# Patient Record
Sex: Female | Born: 1990 | Race: White | Hispanic: No | Marital: Single
Health system: Midwestern US, Community
[De-identification: ages and names within clinical notes are randomized; demographics above are authoritative.]

## PROBLEM LIST (undated history)

## (undated) DIAGNOSIS — M797 Fibromyalgia: Secondary | ICD-10-CM

## (undated) HISTORY — PX: TONSILLECTOMY: SUR1361

## (undated) HISTORY — PX: WISDOM TOOTH EXTRACTION: SHX21

---
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
## 2017-10-26 ENCOUNTER — Emergency Department: Payer: BLUE CROSS/BLUE SHIELD

## 2017-10-26 ENCOUNTER — Encounter: Payer: Self-pay | Admitting: Intensive Care

## 2017-10-26 ENCOUNTER — Emergency Department
Admission: EM | Admit: 2017-10-26 | Discharge: 2017-10-26 | Disposition: A | Discharge status: Home / Self Care | Payer: BLUE CROSS/BLUE SHIELD | Attending: Emergency Medicine | Admitting: Emergency Medicine

## 2017-10-26 DIAGNOSIS — R079 Chest pain, unspecified: Secondary | ICD-10-CM | POA: Diagnosis present

## 2017-10-26 HISTORY — DX: Fibromyalgia: M79.7

## 2017-10-26 LAB — HCG, QUANTITATIVE, PREGNANCY

## 2017-10-26 LAB — BASIC METABOLIC PANEL
Anion gap: 10 (ref 5–15)
BUN: 12 mg/dL (ref 6–20)
CO2: 22 mmol/L (ref 22–32)
Calcium: 9 mg/dL (ref 8.9–10.3)
Chloride: 105 mmol/L (ref 101–111)
Creatinine, Ser: 0.73 mg/dL (ref 0.44–1.00)
GFR calc Af Amer: >60 mL/min (ref >60)
GFR calc non Af Amer: >60 mL/min (ref >60)
GLUCOSE: 97 mg/dL (ref 65–99)
Potassium: 3.9 mmol/L (ref 3.5–5.1)
SODIUM: 137 mmol/L (ref 135–145)

## 2017-10-26 LAB — CBC
HCT: 41.6 % (ref 35.0–47.0)
Hemoglobin: 14 g/dL (ref 12.0–16.0)
MCH: 29.4 pg (ref 26.0–34.0)
MCHC: 33.5 g/dL (ref 32.0–36.0)
MCV: 87.6 fL (ref 80.0–100.0)
Platelets: 290 10*3/uL (ref 150–440)
RBC: 4.75 MIL/uL (ref 3.80–5.20)
RDW: 13.3 % (ref 11.5–14.5)
WBC: 6.8 10*3/uL (ref 3.6–11.0)

## 2017-10-26 LAB — FIBRIN DERIVATIVES D-DIMER (ARMC ONLY): Fibrin derivatives D-dimer (ARMC): 171.28 ng/mL (FEU) (ref 0.00–499.00)

## 2017-10-26 LAB — POCT PREGNANCY, URINE: Preg Test, Ur: NEGATIVE

## 2017-10-26 LAB — TROPONIN I: Troponin I: <0.03 ng/mL (ref <0.03)

## 2017-10-26 MED ORDER — ASPIRIN 81 MG PO CHEW
324.0000 mg | CHEWABLE_TABLET | Freq: Once | ORAL | Status: AC
  Administered 2017-10-26: 324 mg via ORAL
  Filled 2017-10-26: qty 4

## 2017-10-26 NOTE — ED Triage Notes (Addendum)
Patient reports she was at work when her chest pain started about an hour ago. Describes L sided stabbing chest pain with no radiation. Patient drove to Er with no problems. Ambulatory in triage with no problems. Denies anxiety HX. Patient on phone in triage. No respiratory distress noted

## 2017-10-26 NOTE — ED Provider Notes (Signed)
[facility] Emergency Department Provider Note  ____________________________________________  Time seen: Approximately 3:24 PM  I have reviewed the triage vital signs and the nursing notes.   HISTORY  Chief Complaint Chest Pain    HPI Chloe Ramirez is a 27 y.o. female history of fibromyalgia presenting with chest pain.  The patient reports that she was at work at [employer], standing and scanning panties when she had the acute onset of a severe central and slightly left-sided chest pain that was worse with deep breaths.  She did not have any shortness of breath, palpitations, lightheadedness or syncope.  The pain did not radiate.  She has had no lower extremity swelling or calf pain.  She does take oral contraceptive pills.  She denies smoking.  No cocaine use.  Past Medical History:  Diagnosis Date  . Fibromyalgia     There are no active problems to display for this patient.   History reviewed. No pertinent surgical history.    Allergies Phenergan [promethazine hcl] and Sulfa antibiotics  History reviewed. No pertinent family history.  Social History Social History   Tobacco Use  . Smoking status: Never Smoker  . Smokeless tobacco: Never Used  Substance Use Topics  . Alcohol use: Yes    Comment: occ  . Drug use: No    Review of Systems Constitutional: No fever/chills.  B. Eyes: No visual changes. ENT: No sore throat. No congestion or rhinorrhea. Cardiovascular: Positive central and left-sided chest pain. Denies palpitations. Respiratory: Denies shortness of breath.  No cough. Gastrointestinal: No abdominal pain.  No nausea, no vomiting.  No diarrhea.  No constipation. Genitourinary: Negative for dysuria. Musculoskeletal: Negative for back pain.  No lower externally swelling or calf pain. Skin: Negative for rash. Neurological: Negative for headaches. No focal numbness, tingling or weakness.      ____________________________________________   PHYSICAL EXAM:  VITAL SIGNS: ED Triage Vitals  Enc Vitals Group     BP 10/26/17 1236 132/81     Pulse Rate 10/26/17 1236 73     Resp 10/26/17 1236 18     Temp 10/26/17 1236 98.7 F (37.1 C)     Temp Source 10/26/17 1236 Oral     SpO2 10/26/17 1236 100 %     Weight 10/26/17 1237 228 lb (103.4 kg)     Height 10/26/17 1237 5\' 6"  (1.676 m)     Head Circumference --      Peak Flow --      Pain Score 10/26/17 1236 8     Pain Loc --      Pain Edu? --      Excl. in GC? --     Constitutional: Alert and oriented. Well appearing and in no acute distress. Answers questions appropriately. Eyes: Conjunctivae are normal.  EOMI. No scleral icterus. Head: Atraumatic. Nose: No congestion/rhinnorhea. Mouth/Throat: Mucous membranes are moist.  Neck: No stridor.  Supple.   Cardiovascular: Normal rate, regular rhythm. No murmurs, rubs or gallops.  Respiratory: Normal respiratory effort.  No accessory muscle use or retractions. Lungs CTAB.  No wheezes, rales or ronchi. Gastrointestinal: Soft, nontender and nondistended.  No guarding or rebound.  No peritoneal signs. Musculoskeletal: No LE edema. No ttp in the calves or palpable cords.  Negative Homan's sign. Neurologic:  A&Ox3.  Speech is clear.  Face and smile are symmetric.  EOMI.  Moves all extremities well. Skin:  Skin is warm, dry and intact. No rash noted. Psychiatric: Mood and affect are normal. Speech  and behavior are normal.  Normal judgement.  ____________________________________________   LABS (all labs ordered are listed, but only abnormal results are displayed)  Labs Reviewed  BASIC METABOLIC PANEL  CBC  TROPONIN I  HCG, QUANTITATIVE, PREGNANCY  FIBRIN DERIVATIVES D-DIMER ([facility] ONLY)  TROPONIN I  POCT PREGNANCY, URINE   ____________________________________________  EKG  ED ECG REPORT I, Rockne Menghini, the attending physician, personally viewed and  interpreted this ECG.   Date: 10/26/2017  EKG Time: 1233  Rate: 85  Rhythm: normal sinus rhythm  Axis: normal  Intervals:none  ST&T Change: No STEMI  ____________________________________________  RADIOLOGY  Dg Chest 2 View  Result Date: 10/26/2017 CLINICAL DATA:  Chest pain. EXAM: CHEST  2 VIEW COMPARISON:  None. FINDINGS: The heart size and mediastinal contours are within normal limits. Both lungs are clear. The visualized skeletal structures are unremarkable. IMPRESSION: No active cardiopulmonary disease. Electronically Signed   By: Obie Dredge M.D.   On: 10/26/2017 13:06    ____________________________________________   PROCEDURES  Procedure(s) performed: None  Procedures  Critical Care performed: No ____________________________________________   INITIAL IMPRESSION / ASSESSMENT AND PLAN / ED COURSE  Pertinent labs & imaging results that were available during my care of the patient were reviewed by me and considered in my medical decision making (see chart for details).  27 y.o. female with a history of fibromyalgia presenting with pleuritic chest pain that started an hour prior to arrival without any other associated symptoms.  Overall, the patient is hemodynamically stable.  Her EKG does not show any ischemic changes or evidence of arrhythmia including prolonged QTC, Brugada syndrome or hypertrophy.  Given that the patient does have a pleuritic component to her chest pain and is on estrogen, will get a d-dimer for evaluation of possible clot.  However, my pretest suspicion for PE is very low given her normal heart rate, normal oxygenation, and lack of shortness of breath, no evidence of DVT.  Other possible etiologies include GI pathology although she does not have any positional or food related symptoms.  Plan reevaluation for final disposition.  ED Course: The patient had a reassuring course in the emergency department.  She retained remained hemodynamically stable.   Her troponin was negative and she had a normal d-dimer.  The patient was discharged in stable condition.  Return precautions as well as follow-up instructions were discussed  ____________________________________________  FINAL CLINICAL IMPRESSION(S) / ED DIAGNOSES  Final diagnoses:  Chest pain, unspecified type    Clinical Course as of Oct 26 2304  Wed Oct 26, 2017  1657 Troponin I: <0.03 [HV]    Clinical Course User Index [HV] Andris Baumann, [place]      NEW MEDICATIONS STARTED DURING THIS VISIT:  There are no discharge medications for this patient.     Rockne Menghini, MD 10/26/17 [phone]

## 2017-10-26 NOTE — ED Notes (Signed)
Pt ambulatory upon discharge. Verbalized understanding of discharge instructions, follow-up care and pain management. VSS. Skin warm and dry. A&O x4.

## 2017-10-26 NOTE — Discharge Instructions (Signed)
Please return to the emergency department if you develop severe pain, lightheadedness or fainting, shortness of breath, or any other symptoms concerning to you. °

## 2018-03-22 ENCOUNTER — Encounter: Payer: Self-pay | Admitting: Certified Nurse Midwife

## 2018-03-22 ENCOUNTER — Ambulatory Visit (INDEPENDENT_AMBULATORY_CARE_PROVIDER_SITE_OTHER): Payer: BLUE CROSS/BLUE SHIELD | Admitting: Certified Nurse Midwife

## 2018-03-22 VITALS — BP 130/89 | HR 89 | Ht 66.0 in | Wt 240.4 lb

## 2018-03-22 DIAGNOSIS — Z01419 Encounter for gynecological examination (general) (routine) without abnormal findings: Secondary | ICD-10-CM

## 2018-03-22 DIAGNOSIS — E669 Obesity, unspecified: Secondary | ICD-10-CM | POA: Insufficient documentation

## 2018-03-22 DIAGNOSIS — Z6838 Body mass index (BMI) 38.0-38.9, adult: Secondary | ICD-10-CM | POA: Diagnosis not present

## 2018-03-22 DIAGNOSIS — N926 Irregular menstruation, unspecified: Secondary | ICD-10-CM

## 2018-03-22 DIAGNOSIS — Z01411 Encounter for gynecological examination (general) (routine) with abnormal findings: Secondary | ICD-10-CM

## 2018-03-22 MED ORDER — NORETHIN ACE-ETH ESTRAD-FE 1-20 MG-MCG PO TABS: 1.0000 | ORAL_TABLET | Freq: Every day | ORAL | 11 refills | Status: DC

## 2018-03-22 NOTE — Patient Instructions (Addendum)
Preventive Care 18-39 Years, Female Preventive care refers to lifestyle choices and visits with your health care provider that can promote health and wellness. What does preventive care include?  A yearly physical exam. This is also called an annual well check.  Dental exams once or twice a year.  Routine eye exams. Ask your health care provider how often you should have your eyes checked.  Personal lifestyle choices, including: ? Daily care of your teeth and gums. ? Regular physical activity. ? Eating a healthy diet. ? Avoiding tobacco and drug use. ? Limiting alcohol use. ? Practicing safe sex. ? Taking vitamin and mineral supplements as recommended by your health care provider. What happens during an annual well check? The services and screenings done by your health care provider during your annual well check will depend on your age, overall health, lifestyle risk factors, and family history of disease. Counseling Your health care provider may ask you questions about your:  Alcohol use.  Tobacco use.  Drug use.  Emotional well-being.  Home and relationship well-being.  Sexual activity.  Eating habits.  Work and work Statistician.  Method of birth control.  Menstrual cycle.  Pregnancy history.  Screening You may have the following tests or measurements:  Height, weight, and BMI.  Diabetes screening. This is done by checking your blood sugar (glucose) after you have not eaten for a while (fasting).  Blood pressure.  Lipid and cholesterol levels. These may be checked every 5 years starting at age 66.  Skin check.  Hepatitis C blood test.  Hepatitis B blood test.  Sexually transmitted disease (STD) testing.  BRCA-related cancer screening. This may be done if you have a family history of breast, ovarian, tubal, or peritoneal cancers.  Pelvic exam and Pap test. This may be done every 3 years starting at age 40. Starting at age 59, this may be done every 5  years if you have a Pap test in combination with an HPV test.  Discuss your test results, treatment options, and if necessary, the need for more tests with your health care provider. Vaccines Your health care provider may recommend certain vaccines, such as:  Influenza vaccine. This is recommended every year.  Tetanus, diphtheria, and acellular pertussis (Tdap, Td) vaccine. You may need a Td booster every 10 years.  Varicella vaccine. You may need this if you have not been vaccinated.  HPV vaccine. If you are 69 or younger, you may need three doses over 6 months.  Measles, mumps, and rubella (MMR) vaccine. You may need at least one dose of MMR. You may also need a second dose.  Pneumococcal 13-valent conjugate (PCV13) vaccine. You may need this if you have certain conditions and were not previously vaccinated.  Pneumococcal polysaccharide (PPSV23) vaccine. You may need one or two doses if you smoke cigarettes or if you have certain conditions.  Meningococcal vaccine. One dose is recommended if you are age 27-21 years and a first-year college student living in a residence hall, or if you have one of several medical conditions. You may also need additional booster doses.  Hepatitis A vaccine. You may need this if you have certain conditions or if you travel or work in places where you may be exposed to hepatitis A.  Hepatitis B vaccine. You may need this if you have certain conditions or if you travel or work in places where you may be exposed to hepatitis B.  Haemophilus influenzae type b (Hib) vaccine. You may need this if  you have certain risk factors.  Talk to your health care provider about which screenings and vaccines you need and how often you need them. This information is not intended to replace advice given to you by your health care provider. Make sure you discuss any questions you have with your health care provider. Document Released: 10/19/2001 Document Revised: 05/12/2016  Document Reviewed: 06/24/2015 Elsevier Interactive Patient Education  2018 [employer] Calories are units of energy. Your body needs a certain amount of calories from food to keep you going throughout the day. When you eat more calories than your body needs, your body stores the extra calories as fat. When you eat fewer calories than your body needs, your body burns fat to get the energy it needs. Calorie counting means keeping track of how many calories you eat and drink each day. Calorie counting can be helpful if you need to lose weight. If you make sure to eat fewer calories than your body needs, you should lose weight. Ask your health care provider what a healthy weight is for you. For calorie counting to work, you will need to eat the right number of calories in a day in order to lose a healthy amount of weight per week. A dietitian can help you determine how many calories you need in a day and will give you suggestions on how to reach your calorie goal.  A healthy amount of weight to lose per week is usually 1-2 lb (0.5-0.9 kg). This usually means that your daily calorie intake should be reduced by 500-750 calories.  Eating 1,200 - 1,500 calories per day can help most women lose weight.  Eating 1,500 - 1,800 calories per day can help most men lose weight.  What is my plan? My goal is to have __________ calories per day. If I have this many calories per day, I should lose around __________ pounds per week. What do I need to know about calorie counting? In order to meet your daily calorie goal, you will need to:  Find out how many calories are in each food you would like to eat. Try to do this before you eat.  Decide how much of the food you plan to eat.  Write down what you ate and how many calories it had. Doing this is called keeping a food log.  To successfully lose weight, it is important to balance calorie counting with a healthy lifestyle  that includes regular activity. Aim for 150 minutes of moderate exercise (such as walking) or 75 minutes of vigorous exercise (such as running) each week. Where do I find calorie information?  The number of calories in a food can be found on a Nutrition Facts label. If a food does not have a Nutrition Facts label, try to look up the calories online or ask your dietitian for help. Remember that calories are listed per serving. If you choose to have more than one serving of a food, you will have to multiply the calories per serving by the amount of servings you plan to eat. For example, the label on a package of bread might say that a serving size is 1 slice and that there are 90 calories in a serving. If you eat 1 slice, you will have eaten 90 calories. If you eat 2 slices, you will have eaten 180 calories. How do I keep a food log? Immediately after each meal, record the following information in your food log:  What you ate. Don't forget to include toppings, sauces, and other extras on the food.  How much you ate. This can be measured in cups, ounces, or number of items.  How many calories each food and drink had.  The total number of calories in the meal.  Keep your food log near you, such as in a small notebook in your pocket, or use a mobile app or website. Some programs will calculate calories for you and show you how many calories you have left for the day to meet your goal. What are some calorie counting tips?  Use your calories on foods and drinks that will fill you up and not leave you hungry: ? Some examples of foods that fill you up are nuts and nut butters, vegetables, lean proteins, and high-fiber foods like whole grains. High-fiber foods are foods with more than 5 g fiber per serving. ? Drinks such as sodas, specialty coffee drinks, alcohol, and juices have a lot of calories, yet do not fill you up.  Eat nutritious foods and avoid empty calories. Empty calories are calories you  get from foods or beverages that do not have many vitamins or protein, such as candy, sweets, and soda. It is better to have a nutritious high-calorie food (such as an avocado) than a food with few nutrients (such as a bag of chips).  Know how many calories are in the foods you eat most often. This will help you calculate calorie counts faster.  Pay attention to calories in drinks. Low-calorie drinks include water and unsweetened drinks.  Pay attention to nutrition labels for "low fat" or "fat free" foods. These foods sometimes have the same amount of calories or more calories than the full fat versions. They also often have added sugar, starch, or salt, to make up for flavor that was removed with the fat.  Find a way of tracking calories that works for you. Get creative. Try different apps or programs if writing down calories does not work for you. What are some portion control tips?  Know how many calories are in a serving. This will help you know how many servings of a certain food you can have.  Use a measuring cup to measure serving sizes. You could also try weighing out portions on a kitchen scale. With time, you will be able to estimate serving sizes for some foods.  Take some time to put servings of different foods on your favorite plates, bowls, and cups so you know what a serving looks like.  Try not to eat straight from a bag or box. Doing this can lead to overeating. Put the amount you would like to eat in a cup or on a plate to make sure you are eating the right portion.  Use smaller plates, glasses, and bowls to prevent overeating.  Try not to multitask (for example, watch TV or use your computer) while eating. If it is time to eat, sit down at a table and enjoy your food. This will help you to know when you are full. It will also help you to be aware of what you are eating and how much you are eating. What are tips for following this plan? Reading food labels  Check the  calorie count compared to the serving size. The serving size may be smaller than what you are used to eating.  Check the source of the calories. Make sure the food you are eating is high in vitamins and protein and low  in saturated and trans fats. Shopping  Read nutrition labels while you shop. This will help you make healthy decisions before you decide to purchase your food.  Make a grocery list and stick to it. Cooking  Try to cook your favorite foods in a healthier way. For example, try baking instead of frying.  Use low-fat dairy products. Meal planning  Use more fruits and vegetables. Half of your plate should be fruits and vegetables.  Include lean proteins like poultry and fish. How do I count calories when eating out?  Ask for smaller portion sizes.  Consider sharing an entree and sides instead of getting your own entree.  If you get your own entree, eat only half. Ask for a box at the beginning of your meal and put the rest of your entree in it so you are not tempted to eat it.  If calories are listed on the menu, choose the lower calorie options.  Choose dishes that include vegetables, fruits, whole grains, low-fat dairy products, and lean protein.  Choose items that are boiled, broiled, grilled, or steamed. Stay away from items that are buttered, battered, fried, or served with cream sauce. Items labeled "crispy" are usually fried, unless stated otherwise.  Choose water, low-fat milk, unsweetened iced tea, or other drinks without added sugar. If you want an alcoholic beverage, choose a lower calorie option such as a glass of wine or light beer.  Ask for dressings, sauces, and syrups on the side. These are usually high in calories, so you should limit the amount you eat.  If you want a salad, choose a garden salad and ask for grilled meats. Avoid extra toppings like bacon, cheese, or fried items. Ask for the dressing on the side, or ask for olive oil and vinegar or lemon  to use as dressing.  Estimate how many servings of a food you are given. For example, a serving of cooked rice is  cup or about the size of half a baseball. Knowing serving sizes will help you be aware of how much food you are eating at restaurants. The list below tells you how big or small some common portion sizes are based on everyday objects: ? 1 oz-4 stacked dice. ? 3 oz-1 deck of cards. ? 1 tsp-1 die. ? 1 Tbsp- a ping-pong ball. ? 2 Tbsp-1 ping-pong ball. ?  cup- baseball. ? 1 cup-1 baseball. Summary  Calorie counting means keeping track of how many calories you eat and drink each day. If you eat fewer calories than your body needs, you should lose weight.  A healthy amount of weight to lose per week is usually 1-2 lb (0.5-0.9 kg). This usually means reducing your daily calorie intake by 500-750 calories.  The number of calories in a food can be found on a Nutrition Facts label. If a food does not have a Nutrition Facts label, try to look up the calories online or ask your dietitian for help.  Use your calories on foods and drinks that will fill you up, and not on foods and drinks that will leave you hungry.  Use smaller plates, glasses, and bowls to prevent overeating. This information is not intended to replace advice given to you by your health care provider. Make sure you discuss any questions you have with your health care provider. Document Released: 08/23/2005 Document Revised: 07/23/2016 Document Reviewed: 07/23/2016 Elsevier Interactive Patient Education  [employer].

## 2018-03-22 NOTE — Progress Notes (Signed)
GYNECOLOGY ANNUAL PREVENTATIVE CARE ENCOUNTER NOTE  Subjective:   Chloe Ramirez is a 27 y.o. G0P0000 female here for a routine annual gynecologic exam.  Current complaints: Difficulty loosing weight. Pt states that she wants to become pregnant next year. She admits to having history of irregular periods and it was mentioned to her by her previous MD that she may have PCOS. She has never had a formal work up for it.  She is currently on [place] for birth control. Her previous doctor put her on this because she had headaches.  Denies abnormal vaginal bleeding, discharge, pelvic pain, problems with intercourse or other gynecologic concerns.    Gynecologic History Patient's last menstrual period was 03/02/2018 (exact date). Contraception: oral progesterone-only contraceptive Last Pap: it been a while. Results were: states she had abnormal results with further testing was normal.  Last mammogram: N/A.   Obstetric History OB History  Gravida Para Term Preterm AB Living  0 0 0 0 0 0  SAB TAB Ectopic Multiple Live Births  0 0 0 0 0    Past Medical History:  Diagnosis Date  . Fibromyalgia     Past Surgical History:  Procedure Laterality Date  . TONSILLECTOMY    . WISDOM TOOTH EXTRACTION      Current Outpatient Medications on File Prior to Visit  Medication Sig Dispense Refill  . CAMILA 0.35 MG tablet Take 1 tablet by mouth daily.  1  . DULoxetine (CYMBALTA) 30 MG capsule Take by mouth daily.  2  . gabapentin (NEURONTIN) 300 MG capsule TAKE 1 CAPSULE (300 MG TOTAL) BY MOUTH NIGHTLY AS NEEDED (BACK PAIN).  11   No current facility-administered medications on file prior to visit.     Allergies  Allergen Reactions  . Phenergan [Promethazine Hcl]     Causes panic attacks and nausea  . Sulfa Antibiotics     swelling  . Latex Rash    Social History:  reports that she has never smoked. She has never used smokeless tobacco. She reports that she drinks alcohol. She reports that  she does not use drugs.  Exercises 4 days a week for 1 1/2-2 hrs. She eats fast food 1-2 month and has been trying really hard to lose weight with no success.  No family history on file.  The following portions of the patient's history were reviewed and updated as appropriate: allergies, current medications, past family history, past medical history, past social history, past surgical history and problem list.  Review of Systems Pertinent items noted in HPI and remainder of comprehensive ROS otherwise negative. Review of Systems  Constitutional: Negative.   HENT: Negative.   Eyes: Negative.   Respiratory: Negative.   Cardiovascular: Negative.   Gastrointestinal: Negative.   Genitourinary: Negative.   Musculoskeletal: Negative.   Neurological: Negative.   Endo/Heme/Allergies: Negative.   Skin: excess hair on face, arms abdomen    Objective:  BP 130/89   Pulse 89   Ht '5\' 6"'  (1.676 m)   Wt 240 lb 6 oz (109 kg)   LMP 03/02/2018 (Exact Date)   BMI 38.80 kg/m  CONSTITUTIONAL: Well-developed, well-nourished, obese female in no acute distress.  HENT:  Normocephalic, atraumatic, External right and left ear normal. Oropharynx is clear and moist EYES: Conjunctivae and EOM are normal. Pupils are equal, round, and reactive to light. No scleral icterus.  NECK: Normal range of motion, supple, no masses.  Slight enlarged on right side.  SKIN: Skin is warm and dry. No rash  noted. Not diaphoretic. No erythema. No pallor. MUSCULOSKELETAL: Normal range of motion. No tenderness.  No cyanosis, clubbing, or edema.  2+ distal pulses. NEUROLOGIC: Alert and oriented to person, place, and time. Normal reflexes, muscle tone coordination. No cranial nerve deficit noted. PSYCHIATRIC: Normal mood and affect. Normal behavior. Normal judgment and thought content. CARDIOVASCULAR: Normal heart rate noted, regular rhythm RESPIRATORY: Clear to auscultation bilaterally. Effort and breath sounds normal, no problems  with respiration noted. BREASTS: Symmetric in size. No masses, skin changes, nipple drainage, or lymphadenopathy. fibrocystic tissue  ABDOMEN: Soft, normal bowel sounds, no distention noted.  No tenderness, rebound or guarding.  PELVIC: Normal appearing external genitalia; normal appearing vaginal mucosa and cervix.  No abnormal discharge noted.  Pap smear obtained. Contract bleeding with pap.  Normal uterine size, no other palpable masses, no uterine or adnexal tenderness.Declines testing for STD.   Assessment and Plan:  Well Women Exam Will follow up results of pap smear and manage accordingly. Mammogram N/A Labs Pap, Thyroid and lipid panel  Discussed weight loss to improve fertility. Discussed changing her pill to combined. She state her headache are cause by not wearing her glasses or looking at computer. She denies any auras. She denies contraindications to pill. Order for Loestrin placed. She will switch from [facility] to [facility] to help get cycle regular then stop when she is ready to conceive. Will use ovulation predictor kit to help identify ovulation.   Routine preventative health maintenance measures emphasized. Red flag symptoms reviewed for pill. She verbalizes and agree to plan of care. Follow up PRN or sooner as needed.  Please refer to After Visit Summary for other counseling recommendations.   Philip Aspen, CNM

## 2018-03-22 NOTE — Progress Notes (Signed)
New pt is here for an annual exam. LPS 1 year ago and was neg.

## 2018-03-23 ENCOUNTER — Telehealth: Payer: Self-pay

## 2018-03-23 LAB — LIPID PANEL
CHOL/HDL RATIO: 4.4 ratio (ref 0.0–4.4)
Cholesterol, Total: 182 mg/dL (ref 100–199)
HDL: 41 mg/dL (ref >39)
LDL Calculated: 115 mg/dL — ABNORMAL HIGH (ref 0–99)
Triglycerides: 131 mg/dL (ref 0–149)
VLDL CHOLESTEROL CAL: 26 mg/dL (ref 5–40)

## 2018-03-23 LAB — THYROID PANEL WITH TSH
FREE THYROXINE INDEX: 1.8 (ref 1.2–4.9)
T3 UPTAKE RATIO: 24 % (ref 24–39)
T4, Total: 7.7 ug/dL (ref 4.5–12.0)
TSH: 1.19 u[IU]/mL (ref 0.450–4.500)

## 2018-03-23 NOTE — Telephone Encounter (Signed)
Informed pt of test results and ATs instructions. Info mailed per pt request.

## 2018-03-25 ENCOUNTER — Other Ambulatory Visit: Payer: Self-pay | Admitting: Certified Nurse Midwife

## 2018-03-25 LAB — PAP IG, CT-NG, RFX HPV ASCU
Chlamydia, Nuc. Acid Amp: POSITIVE — AB
GONOCOCCUS BY NUCLEIC ACID AMP: NEGATIVE
PAP SMEAR COMMENT: 0

## 2018-03-25 MED ORDER — AZITHROMYCIN 500 MG PO TABS: 1000.0000 mg | ORAL_TABLET | Freq: Once | ORAL | 0 refills | Status: AC

## 2018-03-25 NOTE — Progress Notes (Signed)
Pap smear was positive for chlamydia. Treatment ordered. RN notified patient.   Doreene BurkeAnnie Yumalay Circle, CNM

## 2018-03-27 ENCOUNTER — Telehealth: Payer: Self-pay

## 2018-03-27 NOTE — Telephone Encounter (Signed)
Informed pt of positive test result per AT. Instructions given per AT. Pt expressed understanding. Also cholesterol info was sent last week.

## 2018-06-20 IMAGING — CR DG CHEST 2V
1 series · 2 of 2 positions shown · non-contrast
Comparison: None.

CLINICAL DATA: Chest pain.

EXAM:
CHEST  2 VIEW

[Series 1: dg chest 2 view · 0.14mm/px · 2 of 2 slices shown]
[im 1/2]
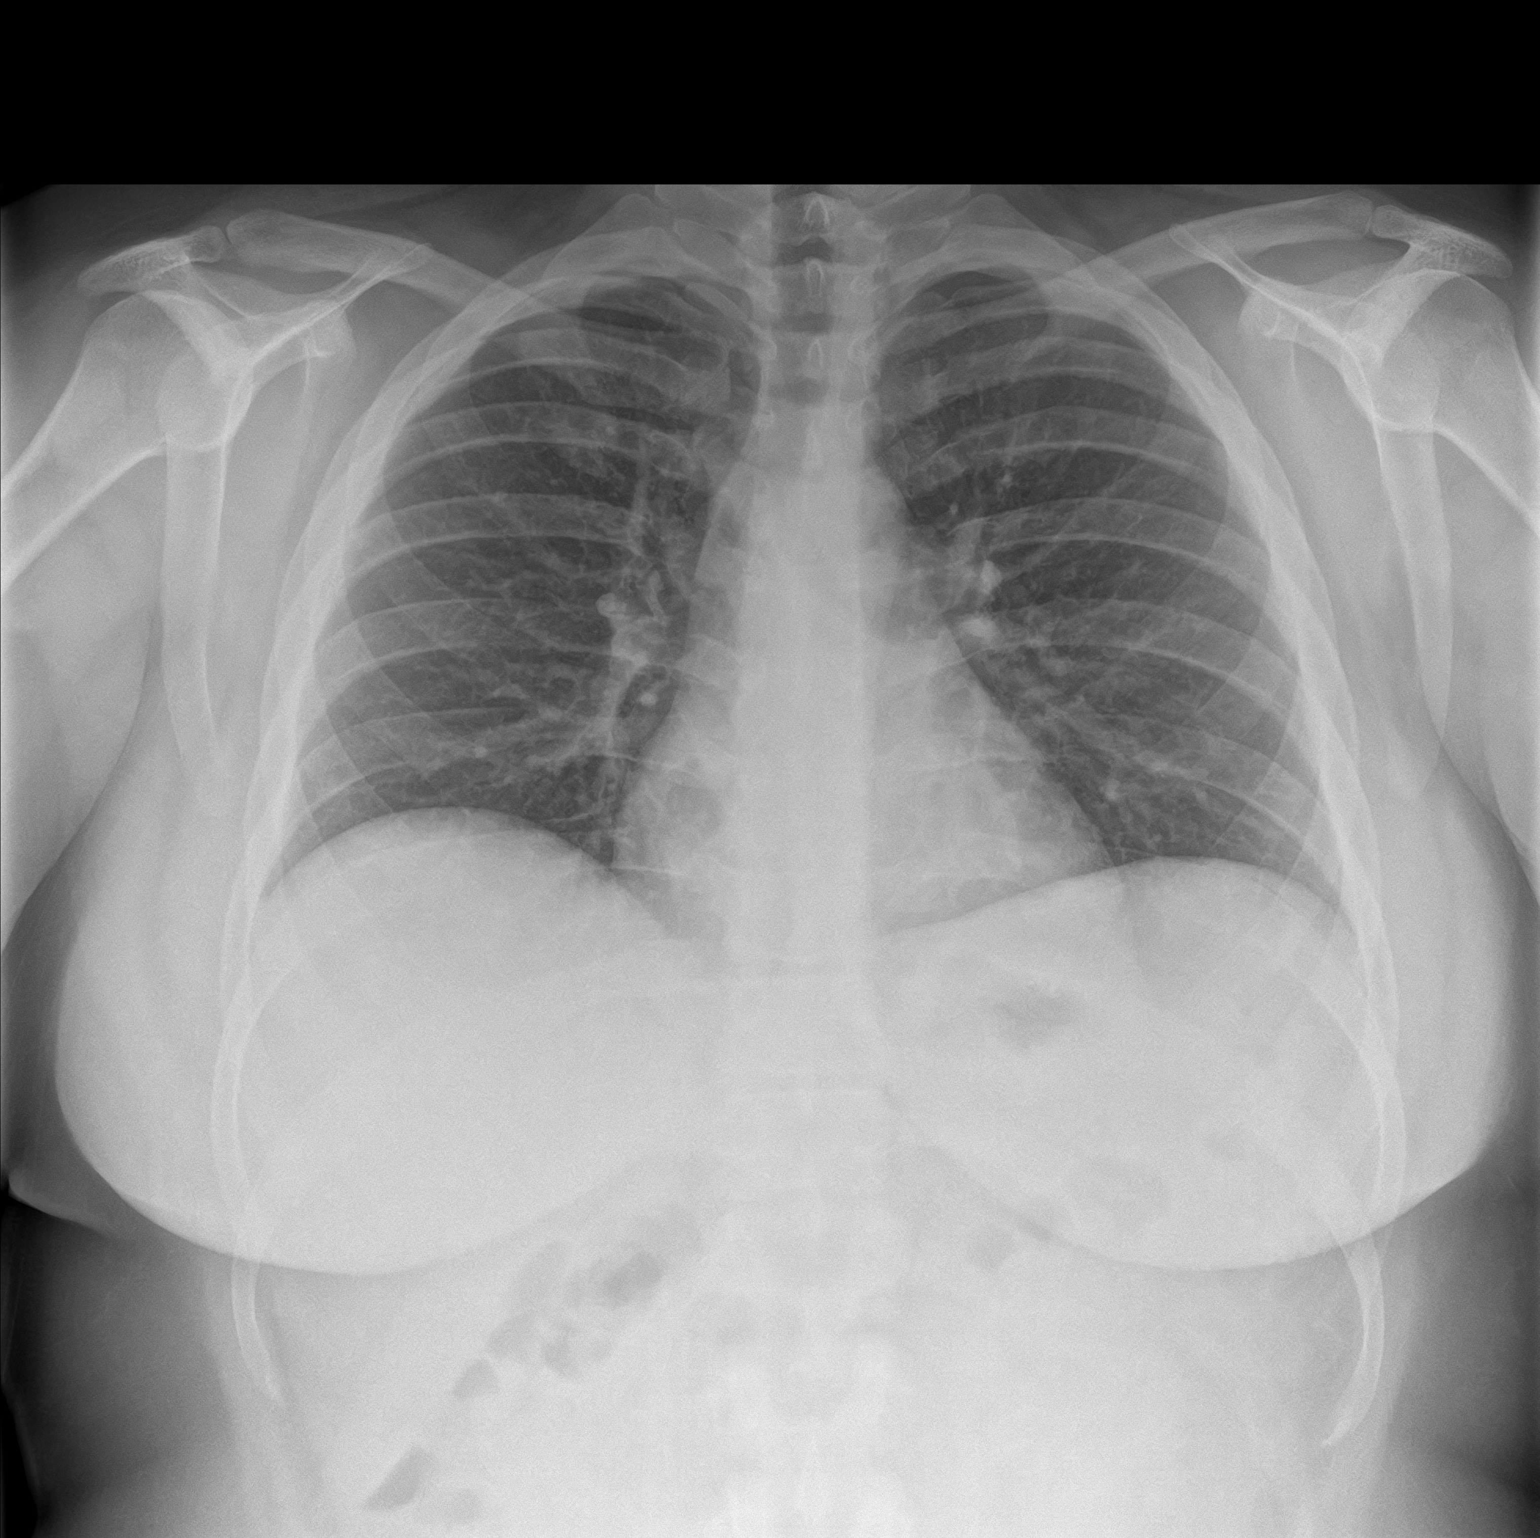
[im 2/2]
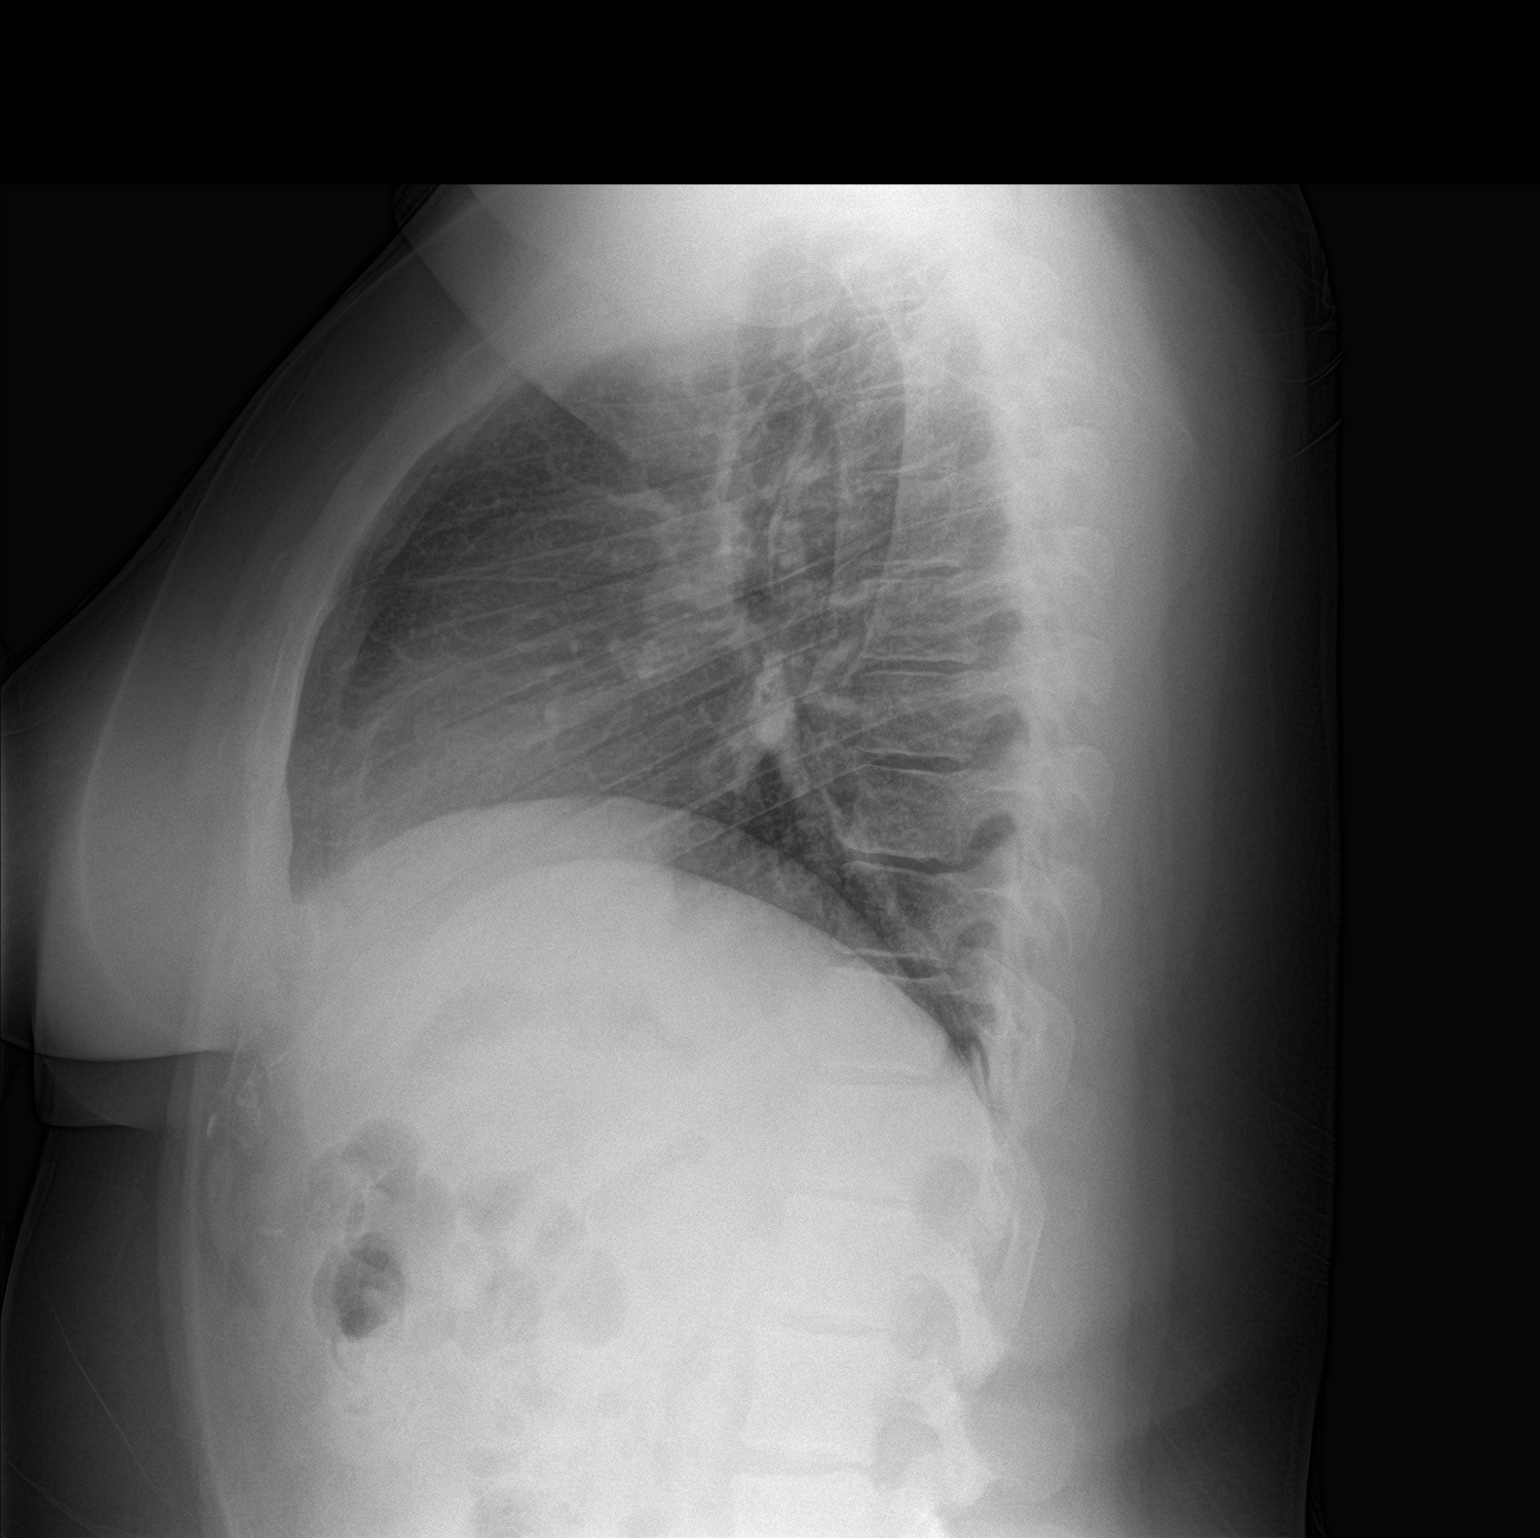

[2 of 2 positions shown; findings below may reference images not displayed]

FINDINGS: The heart size and mediastinal contours are within normal limits.
Both lungs are clear. The visualized skeletal structures are
unremarkable.
IMPRESSION: No active cardiopulmonary disease.

## 2018-06-21 ENCOUNTER — Ambulatory Visit: Payer: BLUE CROSS/BLUE SHIELD | Admitting: Certified Nurse Midwife

## 2018-06-21 ENCOUNTER — Encounter: Payer: Self-pay | Admitting: Certified Nurse Midwife

## 2018-06-21 ENCOUNTER — Other Ambulatory Visit (HOSPITAL_COMMUNITY)
Admission: RE | Admit: 2018-06-21 | Discharge: 2018-06-21 | Disposition: A | Discharge status: Home / Self Care | Payer: BLUE CROSS/BLUE SHIELD | Source: Ambulatory Visit | Attending: Certified Nurse Midwife | Admitting: Certified Nurse Midwife

## 2018-06-21 VITALS — BP 98/68 | HR 69 | Ht 66.0 in | Wt 241.4 lb

## 2018-06-21 DIAGNOSIS — Z202 Contact with and (suspected) exposure to infections with a predominantly sexual mode of transmission: Secondary | ICD-10-CM | POA: Insufficient documentation

## 2018-06-21 NOTE — Patient Instructions (Signed)

## 2018-06-21 NOTE — Progress Notes (Signed)
GYN ENCOUNTER NOTE  Subjective:       Chloe Ramirez is a 27 y.o. G0P0000 female is here for gynecologic evaluation of the following issues:  1. Pt was tested positive for chlamydia in July, she states that her partner was treated but still tested positive and she has had intercourse with him. She denies increase in d/c , odor, itching or burning .    Gynecologic History Patient's last menstrual period was 06/15/2018 (exact date). Contraception: OCP (estrogen/progesterone) Last Pap: 03/2018. Results were: normal Last mammogram: n/a    Obstetric History OB History  Gravida Para Term Preterm AB Living  0 0 0 0 0 0  SAB TAB Ectopic Multiple Live Births  0 0 0 0 0    Past Medical History:  Diagnosis Date  . Fibromyalgia     Past Surgical History:  Procedure Laterality Date  . TONSILLECTOMY    . WISDOM TOOTH EXTRACTION      Current Outpatient Medications on File Prior to Visit  Medication Sig Dispense Refill  . DULoxetine (CYMBALTA) 30 MG capsule Take by mouth daily.  2  . gabapentin (NEURONTIN) 300 MG capsule TAKE 1 CAPSULE (300 MG TOTAL) BY MOUTH NIGHTLY AS NEEDED (BACK PAIN).  11  . norethindrone-ethinyl estradiol (JUNEL FE,GILDESS FE,LOESTRIN FE) 1-20 MG-MCG tablet Take 1 tablet by mouth daily. 1 Package 11   No current facility-administered medications on file prior to visit.     Allergies  Allergen Reactions  . Phenergan [Promethazine Hcl]     Causes panic attacks and nausea  . Sulfa Antibiotics     swelling  . Latex Rash    Social History   Socioeconomic History  . Marital status: Single    Spouse name: Not on file  . Number of children: Not on file  . Years of education: Not on file  . Highest education level: Not on file  Occupational History  . Not on file  Social Needs  . Financial resource strain: Not on file  . Food insecurity:    Worry: Not on file    Inability: Not on file  . Transportation needs:    Medical: Not on file    Non-medical:  Not on file  Tobacco Use  . Smoking status: Never Smoker  . Smokeless tobacco: Never Used  Substance and Sexual Activity  . Alcohol use: Yes    Comment: occ  . Drug use: No  . Sexual activity: Yes    Birth control/protection: Pill  Lifestyle  . Physical activity:    Days per week: Not on file    Minutes per session: Not on file  . Stress: Not on file  Relationships  . Social connections:    Talks on phone: Not on file    Gets together: Not on file    Attends religious service: Not on file    Active member of club or organization: Not on file    Attends meetings of clubs or organizations: Not on file    Relationship status: Not on file  . Intimate partner violence:    Fear of current or ex partner: Not on file    Emotionally abused: Not on file    Physically abused: Not on file    Forced sexual activity: Not on file  Other Topics Concern  . Not on file  Social History Narrative  . Not on file    Family History  Problem Relation Age of Onset  . Cancer Mother   . Ovarian  cancer Paternal Grandmother     The following portions of the patient's history were reviewed and updated as appropriate: allergies, current medications, past family history, past medical history, past social history, past surgical history and problem list.  Review of Systems Review of Systems - Negative except as mentioned in HPI Review of Systems - General ROS: negative for - chills, fatigue, fever, hot flashes, malaise or night sweats Hematological and Lymphatic ROS: negative for - bleeding problems or swollen lymph nodes Gastrointestinal ROS: negative for - abdominal pain, blood in stools, change in bowel habits and nausea/vomiting Musculoskeletal ROS: negative for - joint pain, muscle pain or muscular weakness Genito-Urinary ROS: negative for - change in menstrual cycle, dysmenorrhea, dyspareunia, dysuria, genital discharge, genital ulcers, hematuria, incontinence, irregular/heavy menses, nocturia or  pelvic pain  Objective:   BP 98/68   Pulse 69   Ht 5\' 6"  (1.676 m)   Wt 241 lb 7 oz (109.5 kg)   LMP 06/15/2018 (Exact Date)   BMI 38.97 kg/m  CONSTITUTIONAL: Well-developed, well-nourished female in no acute distress.  HENT:  Normocephalic, atraumatic.  NECK: Normal range of motion, supple, no masses.  Normal thyroid.  SKIN: Skin is warm and dry. No rash noted. Not diaphoretic. No erythema. No pallor. NEUROLGIC: Alert and oriented to person, place, and time. PSYCHIATRIC: Normal mood and affect. Normal behavior. Normal judgment and thought content. CARDIOVASCULAR:Not Examined RESPIRATORY: Not Examined BREASTS: Not Examined ABDOMEN: Soft, non distended; Non tender.  No Organomegaly. PELVIC:  External Genitalia: Normal  BUS: Normal  Vagina: Normal, redness noted  Cervix: Normal, redness noted  MUSCULOSKELETAL: Normal range of motion. No tenderness.  No cyanosis, clubbing, or edema.  Assessment:   Exposure to STD   Plan:   Vaginal swab for BV/Yeast, chlamydia/GC, trichomoniasis. Will follow up with result. Encouraged safe sex practices. Follow up PRN.   Doreene Burke, CNM

## 2018-06-23 LAB — CERVICOVAGINAL ANCILLARY ONLY
Bacterial vaginitis: NEGATIVE
Candida vaginitis: NEGATIVE
Chlamydia: NEGATIVE
NEISSERIA GONORRHEA: NEGATIVE
TRICH (WINDOWPATH): NEGATIVE

## 2019-02-11 ENCOUNTER — Other Ambulatory Visit: Payer: Self-pay | Admitting: Certified Nurse Midwife

## 2019-03-26 ENCOUNTER — Encounter: Payer: BLUE CROSS/BLUE SHIELD | Admitting: Certified Nurse Midwife

## 2019-04-02 ENCOUNTER — Other Ambulatory Visit: Payer: Self-pay | Admitting: Obstetrics and Gynecology

## 2019-04-02 NOTE — Telephone Encounter (Signed)
Pt called no answer called pt to schedule an appointment for annual exam due to it has been a year since pt has been seen by AT. Pt needs refill of birth control pills a refill has been given but pt needs an appointment before anymore refills are given.

## 2019-04-25 ENCOUNTER — Other Ambulatory Visit: Payer: Self-pay | Admitting: Certified Nurse Midwife

## 2019-05-19 ENCOUNTER — Other Ambulatory Visit: Payer: Self-pay | Admitting: Certified Nurse Midwife

## 2019-05-23 ENCOUNTER — Other Ambulatory Visit: Payer: Self-pay | Admitting: Certified Nurse Midwife

## 2020-12-25 ENCOUNTER — Emergency Department: Admit: 2020-12-25

## 2020-12-25 ENCOUNTER — Inpatient Hospital Stay
Admit: 2020-12-25 | Discharge: 2020-12-25 | Disposition: A | Discharge status: Home / Self Care | Attending: Emergency Medicine

## 2020-12-25 DIAGNOSIS — R1011 Right upper quadrant pain: Secondary | ICD-10-CM

## 2020-12-25 LAB — COMPREHENSIVE METABOLIC PANEL
ALT: 24 U/L (ref 13–56)
AST: 18 U/L (ref 10–38)
Albumin/Globulin Ratio: 1.1 (ref 0.8–1.7)
Albumin: 3.8 g/dL (ref 3.4–5.0)
Alkaline Phosphatase: 35 U/L — ABNORMAL LOW (ref 45–117)
Anion Gap: 6 mmol/L (ref 3.0–18)
BUN: 13 MG/DL (ref 7.0–18)
Bun/Cre Ratio: 15 (ref 12–20)
CO2: 25 mmol/L (ref 21–32)
Calcium: 9.4 MG/DL (ref 8.5–10.1)
Chloride: 109 mmol/L (ref 100–111)
Creatinine: 0.85 MG/DL (ref 0.6–1.3)
EGFR IF NonAfrican American: >60 mL/min/{1.73_m2} (ref >60)
GFR African American: >60 mL/min/{1.73_m2} (ref >60)
Globulin: 3.5 g/dL (ref 2.0–4.0)
Glucose: 110 mg/dL — ABNORMAL HIGH (ref 74–99)
Potassium: 3.8 mmol/L (ref 3.5–5.5)
Sodium: 140 mmol/L (ref 136–145)
Total Bilirubin: 0.5 MG/DL (ref 0.2–1.0)
Total Protein: 7.3 g/dL (ref 6.4–8.2)

## 2020-12-25 LAB — LIPASE
Lipase: 205 U/L (ref 73–393)
Lipase: 205 U/L (ref 73–393)

## 2020-12-25 LAB — CBC WITH AUTO DIFFERENTIAL
Basophils %: 0 % (ref 0–2)
Basophils Absolute: 0 10*3/uL (ref 0.0–0.1)
Eosinophils %: 1 % (ref 0–5)
Eosinophils Absolute: 0.1 10*3/uL (ref 0.0–0.4)
Granulocyte Absolute Count: 0 10*3/uL (ref 0.00–0.04)
Hematocrit: 41 % (ref 35.0–45.0)
Hemoglobin: 14 g/dL (ref 12.0–16.0)
Immature Granulocytes: 0 % (ref 0.0–0.5)
Lymphocytes %: 36 % (ref 21–52)
Lymphocytes Absolute: 3.2 10*3/uL (ref 0.9–3.6)
MCH: 29.9 PG (ref 24.0–34.0)
MCHC: 34.1 g/dL (ref 31.0–37.0)
MCV: 87.6 FL (ref 78.0–100.0)
MPV: 9.9 FL (ref 9.2–11.8)
Monocytes %: 8 % (ref 3–10)
Monocytes Absolute: 0.7 10*3/uL (ref 0.05–1.2)
NRBC Absolute: 0 10*3/uL (ref 0.00–0.01)
Neutrophils %: 54 % (ref 40–73)
Neutrophils Absolute: 4.8 10*3/uL (ref 1.8–8.0)
Nucleated RBCs: 0 PER 100 WBC
Platelets: 292 10*3/uL (ref 135–420)
RBC: 4.68 M/uL (ref 4.20–5.30)
RDW: 12.5 % (ref 11.6–14.5)
WBC: 8.9 10*3/uL (ref 4.6–13.2)

## 2020-12-25 LAB — HCG QL SERUM
HCG, Ql.: NEGATIVE
HCG, Ql.: NEGATIVE

## 2020-12-25 LAB — CBC WITH AUTOMATED DIFF
ABS. BASOPHILS: 0 10*3/uL (ref 0.0–0.1)
ABS. EOSINOPHILS: 0.1 10*3/uL (ref 0.0–0.4)
ABS. IMM. GRANS.: 0 10*3/uL (ref 0.00–0.04)
ABS. LYMPHOCYTES: 3.2 10*3/uL (ref 0.9–3.6)
ABS. MONOCYTES: 0.7 10*3/uL (ref 0.05–1.2)
ABS. NEUTROPHILS: 4.8 10*3/uL (ref 1.8–8.0)
ABSOLUTE NRBC: 0 10*3/uL (ref 0.00–0.01)
BASOPHILS: 0 % (ref 0–2)
EOSINOPHILS: 1 % (ref 0–5)
HCT: 41 % (ref 35.0–45.0)
HGB: 14 g/dL (ref 12.0–16.0)
IMMATURE GRANULOCYTES: 0 % (ref 0.0–0.5)
LYMPHOCYTES: 36 % (ref 21–52)
MCH: 29.9 PG (ref 24.0–34.0)
MCHC: 34.1 g/dL (ref 31.0–37.0)
MCV: 87.6 FL (ref 78.0–100.0)
MONOCYTES: 8 % (ref 3–10)
MPV: 9.9 FL (ref 9.2–11.8)
NEUTROPHILS: 54 % (ref 40–73)
NRBC: 0 PER 100 WBC
PLATELET: 292 10*3/uL (ref 135–420)
RBC: 4.68 M/uL (ref 4.20–5.30)
RDW: 12.5 % (ref 11.6–14.5)
WBC: 8.9 10*3/uL (ref 4.6–13.2)

## 2020-12-25 LAB — METABOLIC PANEL, COMPREHENSIVE
A-G Ratio: 1.1 (ref 0.8–1.7)
ALT (SGPT): 24 U/L (ref 13–56)
AST (SGOT): 18 U/L (ref 10–38)
Albumin: 3.8 g/dL (ref 3.4–5.0)
Alk. phosphatase: 35 U/L — ABNORMAL LOW (ref 45–117)
Anion gap: 6 mmol/L (ref 3.0–18)
BUN/Creatinine ratio: 15 (ref 12–20)
BUN: 13 MG/DL (ref 7.0–18)
Bilirubin, total: 0.5 MG/DL (ref 0.2–1.0)
CO2: 25 mmol/L (ref 21–32)
Calcium: 9.4 MG/DL (ref 8.5–10.1)
Chloride: 109 mmol/L (ref 100–111)
Creatinine: 0.85 MG/DL (ref 0.6–1.3)
GFR est AA: >60 mL/min/{1.73_m2} (ref >60)
GFR est non-AA: >60 mL/min/{1.73_m2} (ref >60)
Globulin: 3.5 g/dL (ref 2.0–4.0)
Glucose: 110 mg/dL — ABNORMAL HIGH (ref 74–99)
Potassium: 3.8 mmol/L (ref 3.5–5.5)
Protein, total: 7.3 g/dL (ref 6.4–8.2)
Sodium: 140 mmol/L (ref 136–145)

## 2020-12-25 MED ORDER — IOPAMIDOL 61 % IV SOLN
30061 mg iodine /mL (61 %) | Freq: Once | INTRAVENOUS | Status: AC
Start: 2020-12-25 — End: 2020-12-25
  Administered 2020-12-25: 12:00:00 via INTRAVENOUS

## 2020-12-25 MED ORDER — MORPHINE 4 MG/ML INTRAVENOUS SOLUTION
4 mg/mL | Freq: Once | INTRAVENOUS | Status: AC
Start: 2020-12-25 — End: 2020-12-25
  Administered 2020-12-25: 11:00:00 via INTRAVENOUS

## 2020-12-25 MED ORDER — CYCLOBENZAPRINE 5 MG TAB
5 mg | ORAL_TABLET | Freq: Two times a day (BID) | ORAL | 0 refills | Status: AC
Start: 2020-12-25 — End: 2020-12-30

## 2020-12-25 MED ORDER — HYDROMORPHONE 1 MG/ML INJECTION SOLUTION
1 mg/mL | Freq: Once | INTRAMUSCULAR | Status: AC
Start: 2020-12-25 — End: 2020-12-25
  Administered 2020-12-25: 13:00:00 via INTRAVENOUS

## 2020-12-25 MED ORDER — KETOROLAC TROMETHAMINE 10 MG TAB
10 mg | ORAL_TABLET | Freq: Four times a day (QID) | ORAL | 0 refills | Status: AC | PRN
Start: 2020-12-25 — End: 2020-12-30

## 2020-12-25 MED ORDER — KETOROLAC TROMETHAMINE 15 MG/ML INJECTION
15 mg/mL | Freq: Once | INTRAMUSCULAR | Status: AC
Start: 2020-12-25 — End: 2020-12-25
  Administered 2020-12-25: 14:00:00 via INTRAVENOUS

## 2020-12-25 MED FILL — HYDROMORPHONE (PF) 1 MG/ML IJ SOLN: 1 mg/mL | INTRAMUSCULAR | Qty: 1

## 2020-12-25 MED FILL — ISOVUE-300  61 % INTRAVENOUS SOLUTION: 300 mg iodine /mL (61 %) | INTRAVENOUS | Qty: 100

## 2020-12-25 MED FILL — MORPHINE 4 MG/ML SYRINGE: 4 mg/mL | INTRAMUSCULAR | Qty: 1

## 2020-12-25 MED FILL — KETOROLAC TROMETHAMINE 15 MG/ML INJECTION: 15 mg/mL | INTRAMUSCULAR | Qty: 1

## 2020-12-25 NOTE — ED Notes (Signed)
Dr. Buena Irish at bedside.

## 2020-12-25 NOTE — ED Notes (Signed)
Patient arrived via medic from home with upper abdominal pain that radiates into her chest with a burning sensation into back.

## 2020-12-25 NOTE — ED Provider Notes (Signed)
ED Provider Notes by Manuela Neptune, MD at 12/25/20 [phone]                Author: Manuela Neptune, MD  Service: Emergency Medicine  Author Type: Resident       Filed: 12/25/20 1329  Date of Service: 12/25/20 0659  Status: Attested           Editor: Manuela Neptune, MD (Resident)  Cosigner: Chancy Hurter, MD at 12/25/20 1619          Attestation signed by Chancy Hurter, MD at 12/25/20 1619          I personally saw and examined the patient. I have reviewed and agree with the residents findings, including all diagnostic interpretations, and plans  as written. I was present during the key portions of separately billed procedures.   Chancy Hurter, MD                                    EMERGENCY DEPARTMENT HISTORY AND PHYSICAL EXAM      6:59 AM         Date: 12/25/2020   Patient Name: Tonya Silva        History of Presenting Illness          Chief Complaint       Patient presents with        ?  Abdominal Pain              History Provided By: Patient   Location/Duration/Severity/Modifying factors    Patient presents with severe abdominal pain started this morning and has lasted a few hours waxing and waning.  Patient states that  she has had this pain before and there is been no work-up done in the past as this pain seems to have just resolved on its own however this morning this pain had continued significantly more and worsened for worse than prior.  Patient states the pain  starts in her right upper quadrant and spreads along the underside of her ribs at the same level and spread slightly to the chest when she is not able to sit up.  Patient states that she has difficulty with breathing secondary to the pain, appears tearful.   Patient denies nausea, vomiting, diarrhea, constipation, fever, chills.               PCP: None        Current Outpatient Medications          Medication  Sig  Dispense  Refill           ?  ketorolac (TORADOL) 10 mg tablet  Take 1 Tablet by mouth every six (6) hours as  needed for Pain for up to 5 days.  20 Tablet  0           ?  cyclobenzaprine (FLEXERIL) 5 mg tablet  Take 1 Tablet by mouth two (2) times a day for 5 days.  10 Tablet  0             Past History        Past Medical History:   No past medical history on file.      Past Surgical History:   No past surgical history on file.      Family History:   No family history on file.      Social History:  Social History          Tobacco Use         ?  Smoking status:  Not on file     ?  Smokeless tobacco:  Not on file       Substance Use Topics         ?  Alcohol use:  Not on file         ?  Drug use:  Not on file           Allergies:   No Known Allergies           Review of Systems        Review of Systems    Constitutional: Negative for chills, diaphoresis, fatigue and fever.    HENT: Negative for rhinorrhea.     Respiratory: Negative for cough, chest tightness, shortness of breath, wheezing and stridor.     Cardiovascular: Positive for chest pain. Negative for palpitations and leg swelling.    Gastrointestinal: Positive for abdominal pain. Negative for abdominal distention, constipation, diarrhea, nausea and vomiting.    Genitourinary: Negative for dysuria.    Skin: Negative for rash.    Neurological: Negative for dizziness, tremors, syncope, weakness, light-headedness and headaches.    Psychiatric/Behavioral: Negative for agitation and confusion.               Physical Exam        Visit Vitals      BP  116/72 (BP 1 Location: Left upper arm, BP Patient Position: At rest;Semi fowlers)     Pulse  82     Temp  98.4 ??F (36.9 ??C)     Resp  23     Ht  '5\' 5"'  (1.651 m)     Wt  97.5 kg (215 lb)     SpO2  100%        BMI  35.78 kg/m??           Physical Exam   Constitutional :        General: She is in acute distress.      Appearance: She is well-developed. She is  obese. She is not ill-appearing or diaphoretic.    HENT:       Head: Normocephalic and atraumatic.      Mouth/Throat:      Mouth: Mucous membranes are moist.      Pharynx:  Oropharynx is clear.   Eyes:       Pupils: Pupils are equal, round, and reactive to light.   Cardiovascular:       Rate and Rhythm: Normal rate and regular rhythm.      Heart sounds: Normal heart sounds.    Pulmonary:       Effort: Pulmonary effort is normal.   Abdominal:      General: Abdomen is flat. Bowel sounds are normal. There is no distension. There are no signs of  injury.      Palpations: Abdomen is soft.      Tenderness: There is abdominal tenderness in the  right upper quadrant and epigastric area. There is no guarding or rebound.    Skin:      General: Skin is warm and dry.      Capillary Refill: Capillary refill takes less than 2 seconds.    Neurological:       General: No focal deficit present.      Mental Status: She is alert and oriented to person, place, and time.  Diagnostic Study Results        Labs -     Recent Results (from the past 12 hour(s))     CBC WITH AUTOMATED DIFF          Collection Time: 12/25/20  6:52 AM         Result  Value  Ref Range            WBC  8.9  4.6 - 13.2 K/uL       RBC  4.68  4.20 - 5.30 M/uL       HGB  14.0  12.0 - 16.0 g/dL       HCT  41.0  35.0 - 45.0 %       MCV  87.6  78.0 - 100.0 FL       MCH  29.9  24.0 - 34.0 PG       MCHC  34.1  31.0 - 37.0 g/dL       RDW  12.5  11.6 - 14.5 %       PLATELET  292  135 - 420 K/uL       MPV  9.9  9.2 - 11.8 FL       NRBC  0.0  0 PER 100 WBC       ABSOLUTE NRBC  0.00  0.00 - 0.01 K/uL       NEUTROPHILS  54  40 - 73 %       LYMPHOCYTES  36  21 - 52 %       MONOCYTES  8  3 - 10 %       EOSINOPHILS  1  0 - 5 %       BASOPHILS  0  0 - 2 %       IMMATURE GRANULOCYTES  0  0.0 - 0.5 %       ABS. NEUTROPHILS  4.8  1.8 - 8.0 K/UL       ABS. LYMPHOCYTES  3.2  0.9 - 3.6 K/UL       ABS. MONOCYTES  0.7  0.05 - 1.2 K/UL       ABS. EOSINOPHILS  0.1  0.0 - 0.4 K/UL       ABS. BASOPHILS  0.0  0.0 - 0.1 K/UL       ABS. IMM. GRANS.  0.0  0.00 - 0.04 K/UL       DF  AUTOMATED          METABOLIC PANEL, COMPREHENSIVE          Collection  Time: 12/25/20  6:52 AM         Result  Value  Ref Range            Sodium  140  136 - 145 mmol/L       Potassium  3.8  3.5 - 5.5 mmol/L       Chloride  109  100 - 111 mmol/L       CO2  25  21 - 32 mmol/L       Anion gap  6  3.0 - 18 mmol/L       Glucose  110 (H)  74 - 99 mg/dL       BUN  13  7.0 - 18 MG/DL       Creatinine  0.85  0.6 - 1.3 MG/DL       BUN/Creatinine ratio  15  12 - 20  GFR est AA  >60  >60 ml/min/1.77m       GFR est non-AA  >60  >60 ml/min/1.724m      Calcium  9.4  8.5 - 10.1 MG/DL       Bilirubin, total  0.5  0.2 - 1.0 MG/DL       ALT (SGPT)  24  13 - 56 U/L       AST (SGOT)  18  10 - 38 U/L       Alk. phosphatase  35 (L)  45 - 117 U/L       Protein, total  7.3  6.4 - 8.2 g/dL       Albumin  3.8  3.4 - 5.0 g/dL       Globulin  3.5  2.0 - 4.0 g/dL       A-G Ratio  1.1  0.8 - 1.7         LIPASE          Collection Time: 12/25/20  6:52 AM         Result  Value  Ref Range            Lipase  205  73 - 393 U/L       HCG QL SERUM          Collection Time: 12/25/20  6:52 AM         Result  Value  Ref Range            HCG, Ql.  Negative  NEG         EKG, 12 LEAD, INITIAL          Collection Time: 12/25/20  6:57 AM         Result  Value  Ref Range            Ventricular Rate  74  BPM       Atrial Rate  74  BPM       P-R Interval  142  ms       QRS Duration  88  ms       Q-T Interval  370  ms       QTC Calculation (Bezet)  410  ms       Calculated P Axis  50  degrees       Calculated R Axis  75  degrees       Calculated T Axis  65  degrees       Diagnosis                 Normal sinus rhythm   Normal ECG   No previous ECGs available              Radiologic Studies -      CT ABD PELV W CONT       Final Result     :          1.  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 No gallstones. No secondary sonographic findings of     cholecystitis.                       Medical Decision Making     I am the first provider for this patient.      I reviewed the vital signs, available nursing notes, past medical history, past surgical history, family history and social history.      Vital Signs-Reviewed the patient's vital signs.         EKG: Normal sinus rhythm      Records Reviewed: Nursing Notes  (Time of Review: 6:59 AM)      ED Course: Progress Notes, Reevaluation, and Consults:        ED Course as of 12/25/20 1329       Thu Dec 25, 2020        0708  Patient with right upper quadrant pain concerning for intra-abdominal particularly gallbladder.  Will get CT and labs.  No acute abdomen at this time.  [JL]        [id number]  CT and ultrasound both negative.  Physical exam stretching the abdominal wall elicits these pain in the same way.  Labs all normal.  Given this suspect  that this is a muscular wall injury or inflammation.  We will give Toradol to try and control pain.  If pain can be easily enough controlled, confident in discharging patient home with follow-up with physicians office.  [JL]              ED Course User Index   [JL] Philomena Buttermore, Tommy Medal, MD           Provider Notes (Medical Decision Making):    MDM   Number of Diagnoses or Management Options   Abdominal pain, right upper quadrant   Muscle injury   Diagnosis management comments: Patient with exquisite right upper quadrant/epigastric pain.  Said it started this morning and woke her up and has been lasting for over 2 hours.  Patient has had  episodes similar to this but much less painful in the past.  Given location of pain, concern for cholelithiasis versus pancreatitis versus  appendicitis versus other intra-abdominal pathologies.  Feel it will be prudent to get CT scan and labs including  lipase, CBC, CMP to work-up.      Labs have been been negative for any obvious signs of abdominal pathology.  Imaging likewise has been mostly negative with CT showing concern for potential gallstones however no obvious cholecystitis or other pathology.  Reevaluated patient and done stretches  to the abdominal muscles which have resulted in significant pain as result.  Given this feel that it is likely muscular injury versus inflammation.  Will give Toradol now and discharged with Toradol and muscle relaxant.         Procedures      Critical Care Time: -           Diagnosis        Clinical Impression:       1.  Abdominal pain, right upper quadrant         2.  Muscle injury            Disposition: Discharge home        Follow-up Information               Follow up With  Specialties  Details  Why  Contact Info              Boaz Berisha, Tommy Medal, MD  Family Medicine  Schedule an appointment as soon as possible for a visit   for ED follow up  [address]   [address]   [place] [address]   [facility]    Schedule an appointment as soon as possible for a visit   for ED follow up  [facility], [place]. [place]   [phone]                   Discharge Medication List as of 12/25/2020 10:55 AM              START taking these medications          Details        ketorolac (TORADOL) 10 mg tablet  Take 1 Tablet by mouth every six (6) hours as needed for Pain for up to 5 days., Print, Disp-20 Tablet, R-0               cyclobenzaprine (FLEXERIL) 5 mg tablet  Take 1 Tablet by mouth two (2) times a day for 5 days., Print, Disp-10 Tablet, R-0                      Disclaimer: Sections of this note are dictated using utilizing voice recognition software.  Minor typographical errors may be present. If questions arise, please do not hesitate to contact  me or call our department.

## 2020-12-25 NOTE — ED Notes (Signed)
Patient off floor to ultrasound.

## 2020-12-25 NOTE — ED Notes (Signed)
Patient returned from Ultrasound.

## 2020-12-25 NOTE — ED Notes (Signed)
Patient discharged to home. All personal belongings sent with patient. AVS explained and given to patient. IV taken out. Refused wheel chair. Alyssa RN escorted patient to ER exit. Family is waiting to pick her up.

## 2020-12-26 LAB — EKG 12-LEAD
Atrial Rate: 74 {beats}/min
Diagnosis: NORMAL
P Axis: 50 degrees
P-R Interval: 142 ms
Q-T Interval: 370 ms
QRS Duration: 88 ms
QTc Calculation (Bazett): 410 ms
R Axis: 75 degrees
T Axis: 65 degrees
Ventricular Rate: 74 {beats}/min

## 2020-12-26 LAB — EKG, 12 LEAD, INITIAL
Atrial Rate: 74 {beats}/min
Calculated P Axis: 50 degrees
Calculated R Axis: 75 degrees
Calculated T Axis: 65 degrees
Diagnosis: NORMAL
P-R Interval: 142 ms
Q-T Interval: 370 ms
QRS Duration: 88 ms
QTC Calculation (Bezet): 410 ms
Ventricular Rate: 74 {beats}/min
